# Patient Record
Sex: Female | Born: 1990 | Race: Asian | Hispanic: No | Marital: Single | Smoking: Never smoker
Health system: Southern US, Community
[De-identification: ages and names within clinical notes are randomized; demographics above are authoritative.]

---

## 2011-10-26 ENCOUNTER — Other Ambulatory Visit: Payer: Self-pay | Admitting: Nurse Practitioner

## 2011-10-26 DIAGNOSIS — R221 Localized swelling, mass and lump, neck: Secondary | ICD-10-CM

## 2011-10-27 ENCOUNTER — Other Ambulatory Visit: Payer: Self-pay

## 2012-05-05 LAB — HM PAP SMEAR: HM Pap smear: NORMAL

## 2012-10-15 ENCOUNTER — Ambulatory Visit (INDEPENDENT_AMBULATORY_CARE_PROVIDER_SITE_OTHER): Payer: BC Managed Care – PPO | Admitting: Family Medicine

## 2012-10-15 ENCOUNTER — Encounter: Payer: Self-pay | Admitting: Family Medicine

## 2012-10-15 VITALS — BP 110/78 | Temp 98.6°F | Ht 62.5 in | Wt 150.0 lb

## 2012-10-15 DIAGNOSIS — Z309 Encounter for contraceptive management, unspecified: Secondary | ICD-10-CM

## 2012-10-15 DIAGNOSIS — IMO0001 Reserved for inherently not codable concepts without codable children: Secondary | ICD-10-CM

## 2012-10-15 DIAGNOSIS — Z7689 Persons encountering health services in other specified circumstances: Secondary | ICD-10-CM

## 2012-10-15 DIAGNOSIS — Z7189 Other specified counseling: Secondary | ICD-10-CM

## 2012-10-15 DIAGNOSIS — Z111 Encounter for screening for respiratory tuberculosis: Secondary | ICD-10-CM

## 2012-10-15 NOTE — Progress Notes (Signed)
Chief Complaint  Patient presents with  . Establish Care    HPI:  Anita Lopez is here to establish care. 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 She also needs TB skin test today. Last PCP and physical: she had a pap and physical at [facility] 05/2012 and was normal.  Has the following chronic problems and concerns today: -wants immunization form completed - brought immunizations -TB skin test: not high risks, ha snot worked in health care, no recent travel, no other risk factors, no cough, fevers, night sweats, etc -discuss contraception, not sexually active in the past per her reort, regular normal periods  There are no active problems to display for this patient.  Health Maintenance: -UTD -has had all vaccines and brought report  ROS: See pertinent positives and negatives per HPI.  History reviewed. No pertinent past medical history.  Family History  Problem Relation Age of Onset  . Hypertension Father   . Depression Maternal Aunt   . Depression Maternal Grandmother   . Stroke Paternal Grandmother     History   Social History  . Marital Status: Single    Spouse Name: N/A    Number of Children: N/A  . Years of Education: N/A   Social History Main Topics  . Smoking status: None  . Smokeless tobacco: None  . Alcohol Use: Yes     Comment: 2 drinks about 2 times per year  . Drug Use: None  . Sexually Active: None   Other Topics Concern  . None   Social History Narrative   Work or School: just graduated from [employer], going to pharmacy D school at [employer]      Home Situation: living at home      Spiritual Beliefs: buddhist - practicing      Lifestyle: no regular exercise, diet is ok             No current outpatient prescriptions on file.  EXAMCeasar Mons Vitals:   10/15/12 1115  BP: 110/78  Temp: 98.6 F (37 C)    Body mass index is 26.98 kg/(m^2).  GENERAL: vitals reviewed and  listed above, alert, oriented, appears well hydrated and in no acute distress  HEENT: atraumatic, conjunttiva clear, no obvious abnormalities on inspection of external nose and ears  NECK: no obvious masses on inspection  MS: moves all extremities without noticeable abnormality  PSYCH: pleasant and cooperative, no obvious depression or anxiety  ASSESSMENT AND PLAN:  Discussed the following assessment and plan:  Visit for TB skin test - Plan: TB Skin Test  Encounter to establish care  Contraception  -We reviewed the PMH, PSH, FH, SH, Meds and Allergies. -nurse will complete immunization form -discussed all options/risks/use of contraception, she will think about this and schedule appt, she is not currently sexually active, advised condoms regardless if becomes sexually active -follow up in 2 days to read skin test with nurse -follow up prn  -Patient advised to return or notify a doctor immediately if symptoms worsen or persist or new concerns arise.  Patient Instructions    -PLEASE SIGN UP FOR MYCHART TODAY   We recommend the following healthy lifestyle measures: - eat a healthy diet consisting of lots of vegetables, fruits, beans, nuts, seeds, healthy meats such as white chicken and fish and whole grains.  - avoid fried foods, fast food, processed foods, sodas, red meet and other fattening foods.  - get a  least 150 minutes of aerobic exercise per week.   Follow up in: 2 days with nurse to read skin test      Kriste Basque R.

## 2012-10-15 NOTE — Patient Instructions (Signed)
 -  PLEASE SIGN UP FOR MYCHART TODAY   We recommend the following healthy lifestyle measures: - eat a healthy diet consisting of lots of vegetables, fruits, beans, nuts, seeds, healthy meats such as white chicken and fish and whole grains.  - avoid fried foods, fast food, processed foods, sodas, red meet and other fattening foods.  - get a least 150 minutes of aerobic exercise per week.   Follow up in: 2 days with nurse to read skin test

## 2012-10-17 ENCOUNTER — Other Ambulatory Visit: Payer: BC Managed Care – PPO

## 2012-10-17 ENCOUNTER — Ambulatory Visit (INDEPENDENT_AMBULATORY_CARE_PROVIDER_SITE_OTHER)
Admission: RE | Admit: 2012-10-17 | Discharge: 2012-10-17 | Disposition: A | Discharge status: Home / Self Care | Payer: BC Managed Care – PPO | Source: Ambulatory Visit | Attending: Family Medicine | Admitting: Family Medicine

## 2012-10-17 ENCOUNTER — Other Ambulatory Visit: Payer: Self-pay | Admitting: Family Medicine

## 2012-10-17 DIAGNOSIS — Z111 Encounter for screening for respiratory tuberculosis: Secondary | ICD-10-CM

## 2012-10-17 NOTE — Progress Notes (Signed)
Borderline + PPD, however - per report from staff several positives that day. No risk factors and inderation is irregular and 10x27mm. No symptoms. Will get quantiferon gold and CXR. She has had sevearl negative PD tests in the past including in 2010 per her report.

## 2012-10-18 LAB — TB SKIN TEST: TB Skin Test: POSITIVE

## 2012-10-21 LAB — QUANTIFERON TB GOLD ASSAY (BLOOD)
Interferon Gamma Release Assay: POSITIVE — AB
TB Ag value: 1.55 IU/mL

## 2014-08-10 ENCOUNTER — Ambulatory Visit (INDEPENDENT_AMBULATORY_CARE_PROVIDER_SITE_OTHER): Payer: 59 | Admitting: Family Medicine

## 2014-08-10 ENCOUNTER — Encounter: Payer: Self-pay | Admitting: Family Medicine

## 2014-08-10 VITALS — BP 102/70 | HR 88 | Temp 99.1°F | Ht 62.5 in | Wt 151.5 lb

## 2014-08-10 DIAGNOSIS — R221 Localized swelling, mass and lump, neck: Secondary | ICD-10-CM

## 2014-08-10 NOTE — Patient Instructions (Signed)
BEFORE YOU LEAVE: -schedule urgent US and give her appt details before leaving -labs

## 2014-08-10 NOTE — Progress Notes (Signed)
Pre visit review using our clinic review tool, if applicable. No additional management support is needed unless otherwise documented below in the visit note. 

## 2014-08-10 NOTE — Progress Notes (Signed)
  HPI:   Acute visit for:  Lump on neck: -R ant neck -started about 2 weeks ago to her knowledge -denies: sore throat, pain, dysphagia, SOB, fevers, tooth pain, sinus issues, malaise, weight changes -lives in [place] but moving to [place] in 3 months -wants try to get US neck in next week  ROS: See pertinent positives and negatives per HPI.  No past medical history on file.  No past surgical history on file.  Family History  Problem Relation Age of Onset  . Hypertension Father   . Depression Maternal Aunt   . Depression Maternal Grandmother   . Stroke Paternal Grandmother     History   Social History  . Marital Status: Single    Spouse Name: N/A  . Number of Children: N/A  . Years of Education: N/A   Social History Main Topics  . Smoking status: Never Smoker   . Smokeless tobacco: Not on file  . Alcohol Use: 0.0 oz/week    0 Standard drinks or equivalent per week     Comment: 2 drinks about 2 times per year  . Drug Use: Not on file  . Sexual Activity: Not on file   Other Topics Concern  . None   Social History Narrative   Work or School: just graduated from [employer], going to pharmacy D school at [employer]      Home Situation: living at home      Spiritual Beliefs: buddhist - practicing      Lifestyle: no regular exercise, diet is ok              Current outpatient prescriptions:  .  Ascorbic Acid (VITAMIN C PO), Take by mouth., Disp: , Rfl:  .  Omega-3 Fatty Acids (FISH OIL PO), Take by mouth., Disp: , Rfl:   EXAM:  Filed Vitals:   08/10/14 1614  BP: 102/70  Pulse: 88  Temp: 99.1 F (37.3 C)    Body mass index is 27.25 kg/(m^2).  GENERAL: vitals reviewed and listed above, alert, oriented, appears well hydrated and in no acute distress  HEENT: atraumatic, conjunttiva clear, no obvious abnormalities on inspection of external nose and ears  NECK: firm, non-tender mass ~ 2-3 cm in diameter R ant neck   LUNGS: clear to  auscultation bilaterally, no wheezes, rales or rhonchi, good air movement  CV: HRRR, no peripheral edema  MS: moves all extremities without noticeable abnormality  PSYCH: pleasant and cooperative, no obvious depression or anxiety  ASSESSMENT AND PLAN:  Discussed the following assessment and plan:  Mass present on one side of neck - Plan: TSH, US Soft Tissue Head/Neck  -we discussed possible serious and likely etiologies, workup and treatment, treatment risks and return precautions - suspect thyroid nodule -after this discussion, Anita Lopez opted for US and TSH - lives in [place] but wants to do here -of course, we advised Anita Lopez  to return or notify a doctor immediately if symptoms worsen or persist or new concerns arise.  -Patient advised to return or notify a doctor immediately if symptoms worsen or persist or new concerns arise.  There are no Patient Instructions on file for this visit.   Kriste BasqueKIM, Anita R.

## 2014-08-11 LAB — TSH: TSH: 0.41 u[IU]/mL (ref 0.35–4.50)

## 2014-08-12 ENCOUNTER — Ambulatory Visit
Admission: RE | Admit: 2014-08-12 | Discharge: 2014-08-12 | Disposition: A | Discharge status: Home / Self Care | Payer: 59 | Source: Ambulatory Visit | Attending: Family Medicine | Admitting: Family Medicine

## 2014-08-12 DIAGNOSIS — R221 Localized swelling, mass and lump, neck: Secondary | ICD-10-CM

## 2014-08-14 ENCOUNTER — Other Ambulatory Visit: Payer: Self-pay | Admitting: *Deleted

## 2014-08-14 DIAGNOSIS — E041 Nontoxic single thyroid nodule: Secondary | ICD-10-CM

## 2014-10-14 ENCOUNTER — Telehealth: Payer: Self-pay | Admitting: Family Medicine

## 2014-10-14 NOTE — Telephone Encounter (Signed)
Deborah-can you help me with this?  The referral expiration date is listed as 02/2015?

## 2014-10-14 NOTE — Telephone Encounter (Signed)
Pt is done with school and is now able to see an ENT as suggested by Dr Selena BattenKim. She needs a new referral placed so that Gavin PoundDeborah can expedite please.

## 2016-06-05 IMAGING — US US SOFT TISSUE HEAD/NECK
1 series · 14 of 25 positions shown · non-contrast
Comparison: None.

CLINICAL DATA: Right neck mass x2 weeks, painless. No history of
trauma or surgery.

EXAM:
THYROID ULTRASOUND
TECHNIQUE: Ultrasound examination of the thyroid gland and adjacent soft
tissues was performed.

[Series 1: us soft tissue head/neck · 0.08mm/px · 14 of 60 slices shown]
[im 1/60]
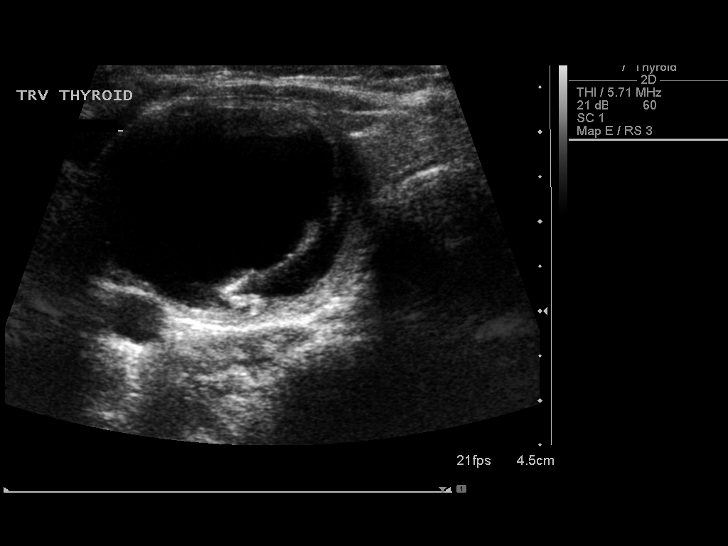
[im 5/60]
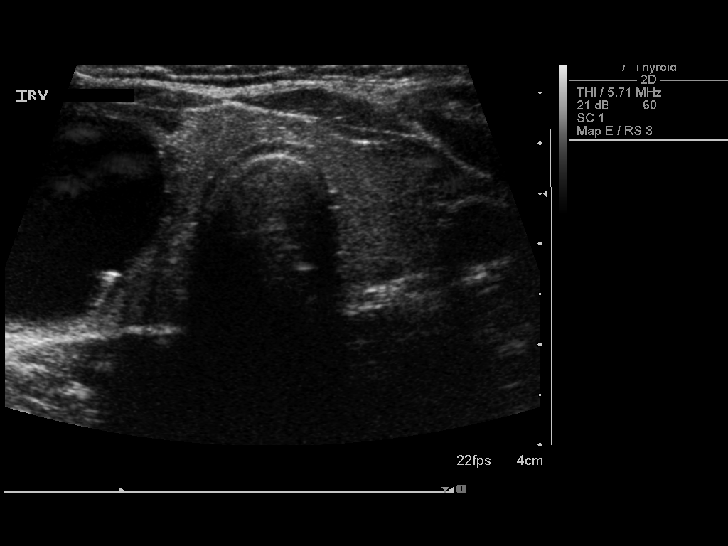
[im 10/60]
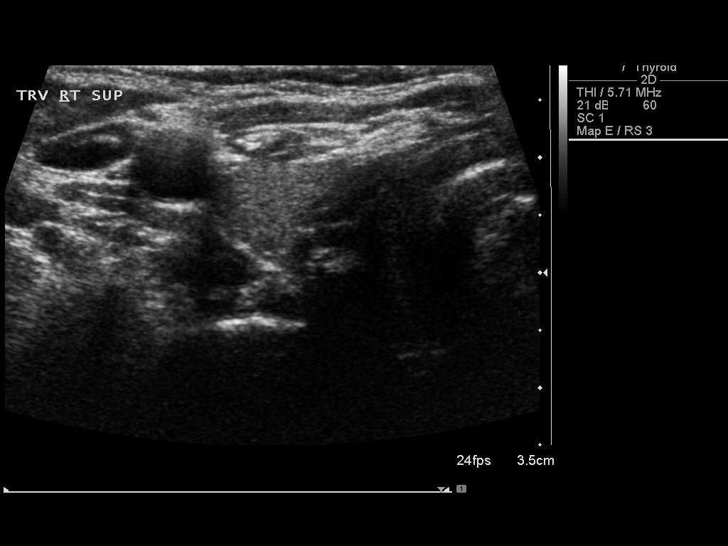
[im 15/60]
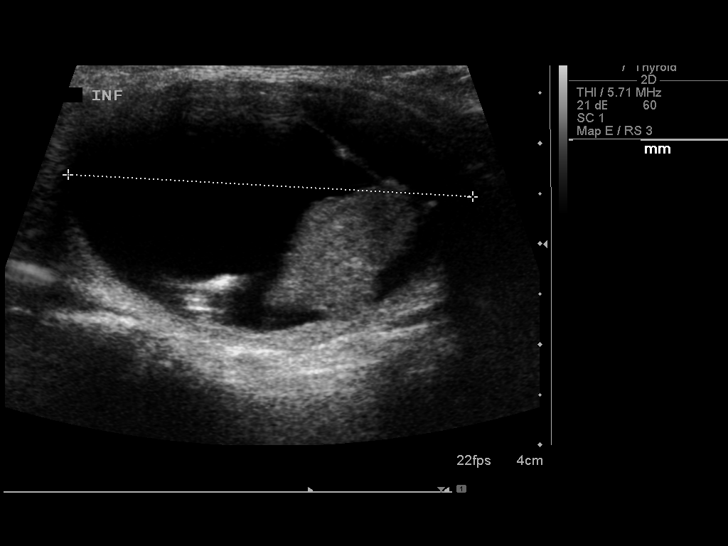
[im 20/60]
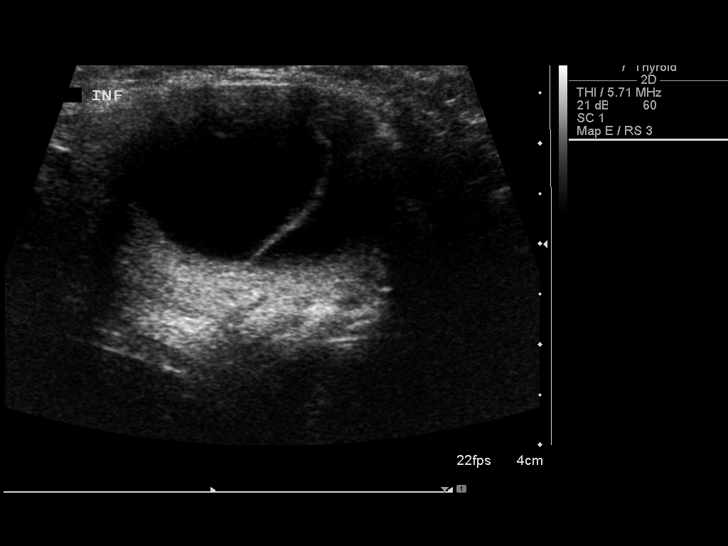
[im 23/60]
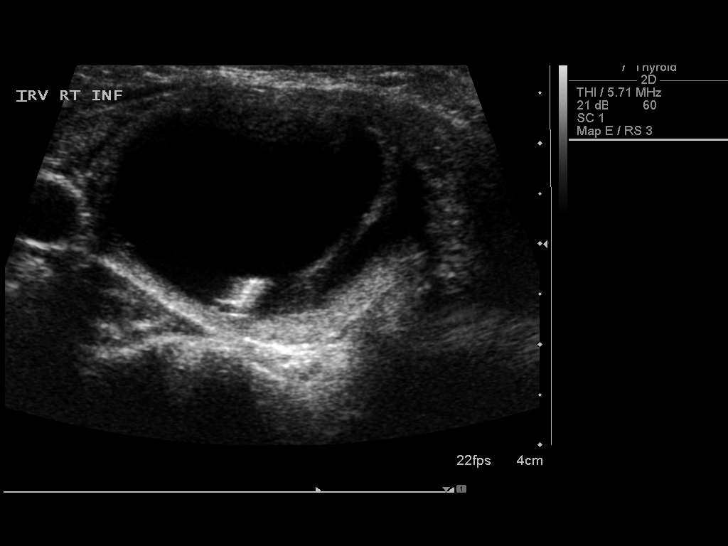
[im 28/60]
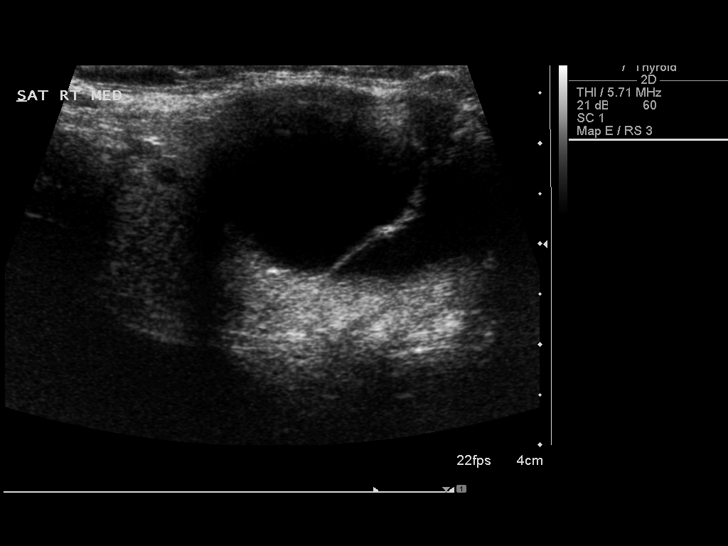
[im 32/60]
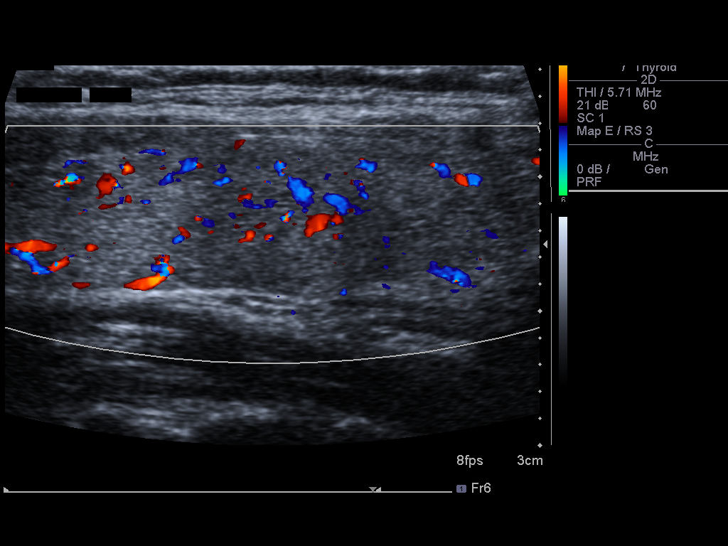
[im 37/60]
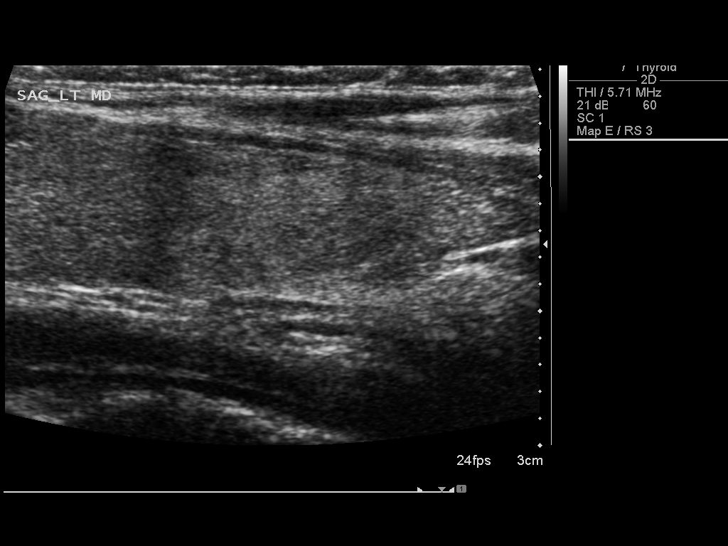
[im 40/60]
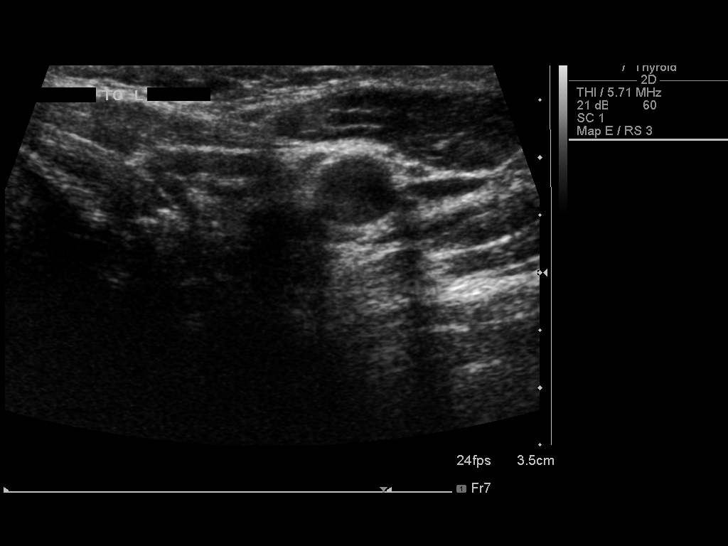
[im 45/60]
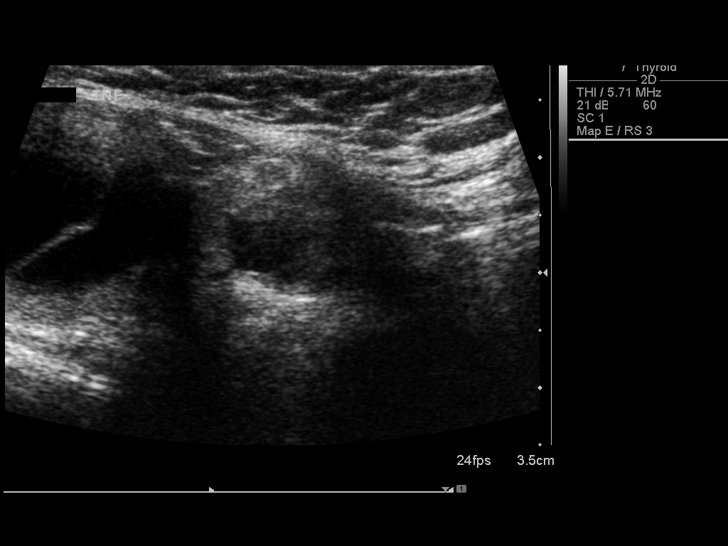
[im 50/60]
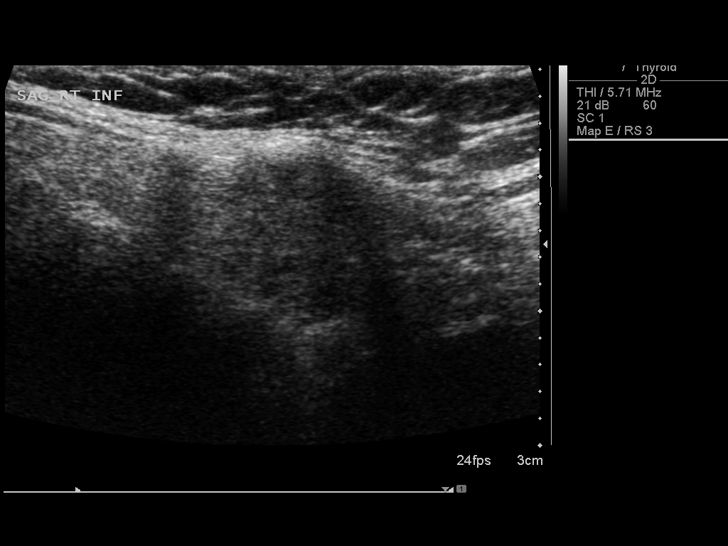
[im 55/60]
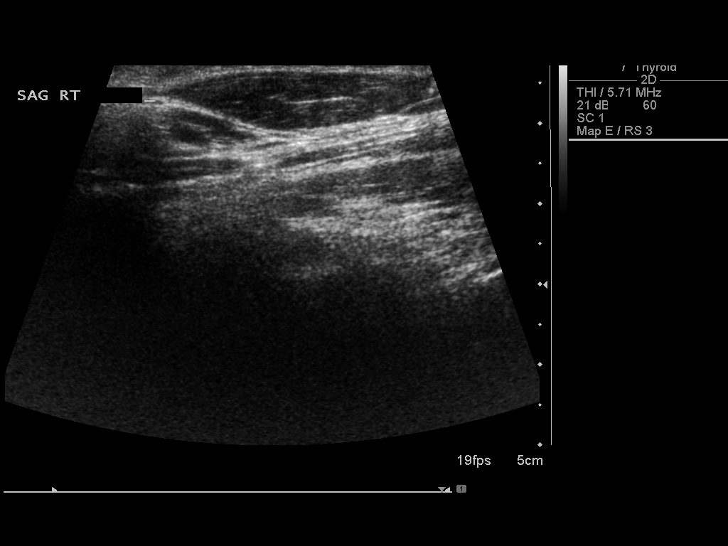
[im 60/60]
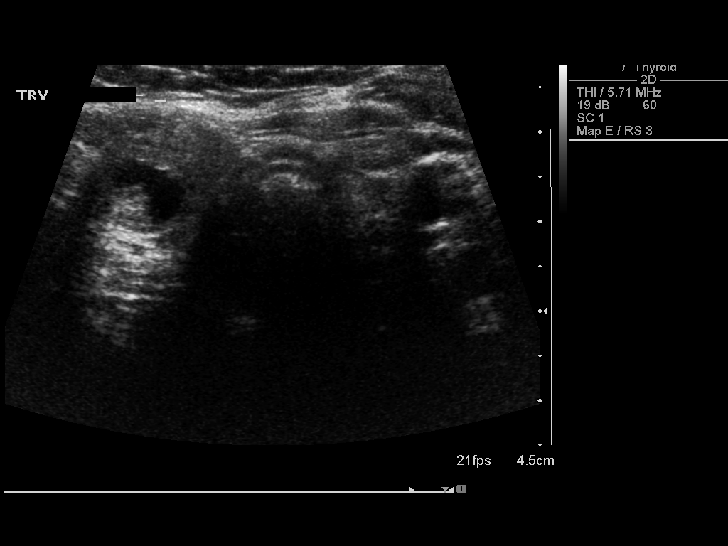

[14 of 25 positions shown; findings below may reference images not displayed]

FINDINGS: Right thyroid lobe

Measurements: 66 x 29 x 36 mm. There is a dominant complex mostly
cystic lesion 35 x 26 x 40 mm in the inferior pole. 10 x 10 x 12 mm
solid nodule inferomedially. There is a nearly isoechoic 12 x 13 mm
nodule at the inferior most aspect of the right lobe.

Left thyroid lobe

Measurements: 50 x 13 x 11 mm.  No nodules visualized.

Isthmus

Thickness: 5.1 mm.  No nodules visualized.

Lymphadenopathy

None visualized.
IMPRESSION: 1. Thyromegaly with several right nodules. The dominant complex
cystic lesion meets consensus criteria for biopsy. Ultrasound-guided
fine needle aspiration should be considered, as per the consensus
statement: Management of Thyroid Nodules Detected at US: Society of
Radiologists in Ultrasound Consensus Conference Statement. Radiology
